# Patient Record
Sex: Male | Born: 1950 | Race: White | Hispanic: No | Marital: Married | Smoking: Never smoker
Health system: Southern US, Community
[De-identification: ages and names within clinical notes are randomized; demographics above are authoritative.]

## PROBLEM LIST (undated history)

## (undated) DIAGNOSIS — I10 Essential (primary) hypertension: Secondary | ICD-10-CM

## (undated) DIAGNOSIS — G4730 Sleep apnea, unspecified: Secondary | ICD-10-CM

## (undated) DIAGNOSIS — F319 Bipolar disorder, unspecified: Secondary | ICD-10-CM

## (undated) DIAGNOSIS — E119 Type 2 diabetes mellitus without complications: Secondary | ICD-10-CM

## (undated) DIAGNOSIS — C801 Malignant (primary) neoplasm, unspecified: Secondary | ICD-10-CM

---

## 2014-05-25 ENCOUNTER — Emergency Department (HOSPITAL_BASED_OUTPATIENT_CLINIC_OR_DEPARTMENT_OTHER)
Admission: EM | Admit: 2014-05-25 | Discharge: 2014-05-25 | Disposition: A | Discharge status: Home / Self Care | Payer: 59 | Attending: Emergency Medicine | Admitting: Emergency Medicine

## 2014-05-25 ENCOUNTER — Emergency Department (HOSPITAL_BASED_OUTPATIENT_CLINIC_OR_DEPARTMENT_OTHER): Payer: 59

## 2014-05-25 ENCOUNTER — Encounter (HOSPITAL_BASED_OUTPATIENT_CLINIC_OR_DEPARTMENT_OTHER): Payer: Self-pay | Admitting: *Deleted

## 2014-05-25 DIAGNOSIS — Y9289 Other specified places as the place of occurrence of the external cause: Secondary | ICD-10-CM | POA: Diagnosis not present

## 2014-05-25 DIAGNOSIS — Z79899 Other long term (current) drug therapy: Secondary | ICD-10-CM | POA: Diagnosis not present

## 2014-05-25 DIAGNOSIS — Y998 Other external cause status: Secondary | ICD-10-CM | POA: Diagnosis not present

## 2014-05-25 DIAGNOSIS — Z7951 Long term (current) use of inhaled steroids: Secondary | ICD-10-CM | POA: Diagnosis not present

## 2014-05-25 DIAGNOSIS — M20011 Mallet finger of right finger(s): Secondary | ICD-10-CM | POA: Diagnosis not present

## 2014-05-25 DIAGNOSIS — F319 Bipolar disorder, unspecified: Secondary | ICD-10-CM | POA: Diagnosis not present

## 2014-05-25 DIAGNOSIS — T1490XA Injury, unspecified, initial encounter: Secondary | ICD-10-CM

## 2014-05-25 DIAGNOSIS — I10 Essential (primary) hypertension: Secondary | ICD-10-CM | POA: Diagnosis not present

## 2014-05-25 DIAGNOSIS — W1839XA Other fall on same level, initial encounter: Secondary | ICD-10-CM | POA: Diagnosis not present

## 2014-05-25 DIAGNOSIS — E119 Type 2 diabetes mellitus without complications: Secondary | ICD-10-CM | POA: Insufficient documentation

## 2014-05-25 DIAGNOSIS — S6991XA Unspecified injury of right wrist, hand and finger(s), initial encounter: Secondary | ICD-10-CM | POA: Diagnosis present

## 2014-05-25 DIAGNOSIS — Y9389 Activity, other specified: Secondary | ICD-10-CM | POA: Insufficient documentation

## 2014-05-25 HISTORY — DX: Sleep apnea, unspecified: G47.30

## 2014-05-25 HISTORY — DX: Type 2 diabetes mellitus without complications: E11.9

## 2014-05-25 HISTORY — DX: Bipolar disorder, unspecified: F31.9

## 2014-05-25 HISTORY — DX: Essential (primary) hypertension: I10

## 2014-05-25 HISTORY — DX: Malignant (primary) neoplasm, unspecified: C80.1

## 2014-05-25 MED ORDER — HYDROCODONE-ACETAMINOPHEN 5-325 MG PO TABS: 1.0000 | ORAL_TABLET | Freq: Four times a day (QID) | ORAL | Status: DC | PRN

## 2014-05-25 MED ORDER — OXYCODONE-ACETAMINOPHEN 5-325 MG PO TABS
2.0000 | ORAL_TABLET | Freq: Once | ORAL | Status: AC
  Administered 2014-05-25: 2 via ORAL
  Filled 2014-05-25: qty 2

## 2014-05-25 NOTE — Discharge Instructions (Signed)
Take tylenol, motrin for pain.   Take vicodin for severe pain. Do NOT drive with it.   Keep the finger splint in place until you see ortho.   See either Dr. Barbaraann Barthel upstairs or Dr. Lenon Curt, hand doctor in a week.   Return to ER if you have severe pain, unable to move fingers.

## 2014-05-25 NOTE — ED Notes (Signed)
Patient states that he fell and landed on his right hand, feels he broke his right middle finger. C/o wrist pain as well

## 2014-05-25 NOTE — ED Notes (Signed)
MD at bedside. 

## 2014-05-25 NOTE — ED Provider Notes (Signed)
CSN: [id number]     Arrival date & time 05/25/14  1340 History   First MD Initiated Contact with Patient 05/25/14 1358     Chief Complaint  Patient presents with  . Finger Injury     (Consider location/radiation/quality/duration/timing/severity/associated sxs/prior Treatment) The history is provided by the patient.  Randy Martinez is a 64 y.o. male hx of HTN, DM, bipolar here with R hand injury. He was carrying some groceries and accidentally fell and landed on right hand. He states that he may have hyperflexed his middle finger and his right middle finger seem deformed afterwards. Also has wrist pain but denies any elbow or shoulder pain. Denies head injury or LOC.    Past Medical History  Diagnosis Date  . Hypertension   . Diabetes mellitus without complication   . Sleep apnea   . Bipolar 1 disorder   . skin    History reviewed. No pertinent past surgical history. No family history on file. History  Substance Use Topics  . Smoking status: Never Smoker   . Smokeless tobacco: Not on file  . Alcohol Use: No    Review of Systems  Musculoskeletal:       R hand and middle finger pain   All other systems reviewed and are negative.     Allergies  Bee venom; Lisinopril; and Medrol  Home Medications   Prior to Admission medications   Medication Sig Start Date End Date Taking? Authorizing Provider  ALPRAZolam Duanne Moron) 0.25 MG tablet Take 0.25 mg by mouth at bedtime as needed for anxiety.   Yes Historical Provider, MD  amLODipine (NORVASC) 10 MG tablet Take 10 mg by mouth daily.   Yes Historical Provider, MD  amphetamine-dextroamphetamine (ADDERALL) 10 MG tablet Take 10 mg by mouth daily with breakfast.   Yes Historical Provider, MD  buPROPion (WELLBUTRIN SR) 200 MG 12 hr tablet Take 200 mg by mouth 2 (two) times daily.   Yes Historical Provider, MD  carbamazepine (TEGRETOL) 200 MG tablet Take 200 mg by mouth 3 (three) times daily.   Yes Historical Provider, MD  cetirizine  (ZYRTEC) 10 MG tablet Take 10 mg by mouth daily.   Yes Historical Provider, MD  cloNIDine (CATAPRES) 0.1 MG tablet Take 0.1 mg by mouth 2 (two) times daily.   Yes Historical Provider, MD  furosemide (LASIX) 20 MG tablet Take 20 mg by mouth.   Yes Historical Provider, MD  losartan (COZAAR) 100 MG tablet Take 100 mg by mouth daily.   Yes Historical Provider, MD  omeprazole (PRILOSEC) 40 MG capsule Take 40 mg by mouth daily.   Yes Historical Provider, MD  simvastatin (ZOCOR) 40 MG tablet Take 40 mg by mouth daily.   Yes Historical Provider, MD  sitaGLIPtin (JANUVIA) 100 MG tablet Take 100 mg by mouth daily.   Yes Historical Provider, MD  terazosin (HYTRIN) 5 MG capsule Take 5 mg by mouth at bedtime.   Yes Historical Provider, MD  traZODone (DESYREL) 50 MG tablet Take 50 mg by mouth at bedtime.   Yes Historical Provider, MD  venlafaxine (EFFEXOR) 100 MG tablet Take 100 mg by mouth 2 (two) times daily.   Yes Historical Provider, MD  mometasone-formoterol (DULERA) 100-5 MCG/ACT AERO Inhale 2 puffs into the lungs 2 (two) times daily.    Historical Provider, MD   BP 164/96 mmHg  Pulse 84  Temp(Src) 98.6 F (37 C) (Oral)  Resp 20  Ht 5\' 8"  (1.727 m)  Wt 278 lb (126.1 kg)  BMI 42.28  kg/m2  SpO2 96% Physical Exam  Constitutional: He is oriented to person, place, and time. He appears well-developed and well-nourished.  HENT:  Head: Normocephalic and atraumatic.  Mouth/Throat: Oropharynx is clear and moist.  Eyes: Pupils are equal, round, and reactive to light.  Neck: Normal range of motion.  Cardiovascular: Normal rate.   Pulmonary/Chest: Effort normal.  Abdominal: Soft.  Musculoskeletal:  R middle finger flexed at PIP joint, unable to extend. No obvious bony tenderness. Mild tenderness over metacarpals but no obvious deformity. Dec ROM wrist and mild wrist tenderness. 2+ pulses, nl capillary refill. No forearm or elbow or shoulder or upper arm tenderness. No other obvious extremity trauma    Neurological: He is alert and oriented to person, place, and time.  Skin: Skin is warm and dry.  Psychiatric: He has a normal mood and affect. His behavior is normal. Judgment and thought content normal.  Nursing note and vitals reviewed.   ED Course  Procedures (including critical care time) Labs Review Labs Reviewed - No data to display  Imaging Review Dg Wrist Complete Right  05/25/2014   CLINICAL DATA:  [place] today and injured right hand and wrist.  EXAM: RIGHT WRIST - COMPLETE 3+ VIEW; RIGHT HAND - COMPLETE 3+ VIEW  COMPARISON:  None.  FINDINGS: Right hand:  The joint spaces are maintained.  No acute bony findings.  Right wrist:  The joint spaces are maintained. Mild degenerative changes. No acute fracture.  IMPRESSION: Mild degenerative changes but no acute fracture.   Electronically Signed   By: Kalman Jewels M.D.   On: 05/25/2014 14:23   Dg Hand Complete Right  05/25/2014   CLINICAL DATA:  [place] today and injured right hand and wrist.  EXAM: RIGHT WRIST - COMPLETE 3+ VIEW; RIGHT HAND - COMPLETE 3+ VIEW  COMPARISON:  None.  FINDINGS: Right hand:  The joint spaces are maintained.  No acute bony findings.  Right wrist:  The joint spaces are maintained. Mild degenerative changes. No acute fracture.  IMPRESSION: Mild degenerative changes but no acute fracture.   Electronically Signed   By: Kalman Jewels M.D.   On: 05/25/2014 14:23     EKG Interpretation None      MDM   Final diagnoses:  Hand injury, right, initial encounter    Randy Martinez is a 64 y.o. male here with fall. Likely mallet finger. Will get xrays and give pain meds.   3:01 PM Xray showed no fracture. Likely mallet finger. Splinted in extension. Given wrist splint for comfort. Will have him f/u with hand.     Wandra Arthurs, MD 05/25/14 [phone]

## 2014-06-03 ENCOUNTER — Encounter: Payer: Self-pay | Admitting: Family Medicine

## 2014-06-03 ENCOUNTER — Ambulatory Visit (INDEPENDENT_AMBULATORY_CARE_PROVIDER_SITE_OTHER): Payer: 59 | Admitting: Family Medicine

## 2014-06-03 VITALS — BP 165/99 | HR 86 | Ht 68.0 in | Wt 280.0 lb

## 2014-06-03 DIAGNOSIS — S63501A Unspecified sprain of right wrist, initial encounter: Secondary | ICD-10-CM

## 2014-06-03 DIAGNOSIS — M20011 Mallet finger of right finger(s): Secondary | ICD-10-CM

## 2014-06-03 MED ORDER — HYDROCODONE-ACETAMINOPHEN 5-325 MG PO TABS: 1.0000 | ORAL_TABLET | Freq: Four times a day (QID) | ORAL | Status: AC | PRN

## 2014-06-03 NOTE — Patient Instructions (Signed)
You have a mallet finger. It's critical you keep this joint extended at all times even when washing it (take the splint off like I showed you). Icing as needed  Ibuprofen or aleve as needed for pain and inflammation. Wear splint at all times for 6 weeks. Typically follow up with me at that time.  Then you use the splint only at bedtime for 2 more weeks assuming healing has occurred. You also have a wrist sprain. Icing, medications as noted above. Wrist brace as needed.

## 2014-06-05 DIAGNOSIS — S63501A Unspecified sprain of right wrist, initial encounter: Secondary | ICD-10-CM | POA: Insufficient documentation

## 2014-06-05 DIAGNOSIS — M20019 Mallet finger of unspecified finger(s): Secondary | ICD-10-CM | POA: Insufficient documentation

## 2014-06-05 NOTE — Assessment & Plan Note (Signed)
Right 3rd digit mallet finger - discussed importance of keeping 3rd DIP in extension for 6 weeks then at bedtime for 2 additional weeks.  Icing, nsaids.  Shown how to wash finger but keep it straight while removing splint.  F/u in 6 weeks at the latest.

## 2014-06-05 NOTE — Assessment & Plan Note (Signed)
reassured.  Wrist brace if needed.  Icing, nsaids.

## 2014-06-05 NOTE — Progress Notes (Signed)
PCP: No primary care provider on file.  Subjective:   HPI: Patient is a 64 y.o. male here for right hand/wrist injury.  Patient reports he was carrying groceries on 1/3 up the steps when he fell upwards and landed with axial loading injury to right 3rd digit. Swelling, pain, bruising distally. Difficulty extending tip of finger. Went to ED - no fractures but placed in a finger extension splint and wrist brace, referred here. No prior injuries.  Past Medical History  Diagnosis Date  . Hypertension   . Diabetes mellitus without complication   . Sleep apnea   . Bipolar 1 disorder   . skin     Current Outpatient Prescriptions on File Prior to Visit  Medication Sig Dispense Refill  . ALPRAZolam (XANAX) 0.25 MG tablet Take 0.25 mg by mouth at bedtime as needed for anxiety.    Marland Kitchen amLODipine (NORVASC) 10 MG tablet Take 10 mg by mouth daily.    Marland Kitchen amphetamine-dextroamphetamine (ADDERALL) 10 MG tablet Take 10 mg by mouth daily with breakfast.    . buPROPion (WELLBUTRIN SR) 200 MG 12 hr tablet Take 200 mg by mouth 2 (two) times daily.    . carbamazepine (TEGRETOL) 200 MG tablet Take 200 mg by mouth 3 (three) times daily.    . cetirizine (ZYRTEC) 10 MG tablet Take 10 mg by mouth daily.    . cloNIDine (CATAPRES) 0.1 MG tablet Take 0.1 mg by mouth 2 (two) times daily.    . furosemide (LASIX) 20 MG tablet Take 20 mg by mouth.    . losartan (COZAAR) 100 MG tablet Take 100 mg by mouth daily.    . mometasone-formoterol (DULERA) 100-5 MCG/ACT AERO Inhale 2 puffs into the lungs 2 (two) times daily.    Marland Kitchen omeprazole (PRILOSEC) 40 MG capsule Take 40 mg by mouth daily.    . simvastatin (ZOCOR) 40 MG tablet Take 40 mg by mouth daily.    . sitaGLIPtin (JANUVIA) 100 MG tablet Take 100 mg by mouth daily.    Marland Kitchen terazosin (HYTRIN) 5 MG capsule Take 5 mg by mouth at bedtime.    . traZODone (DESYREL) 50 MG tablet Take 50 mg by mouth at bedtime.    Marland Kitchen venlafaxine (EFFEXOR) 100 MG tablet Take 100 mg by mouth 2  (two) times daily.     No current facility-administered medications on file prior to visit.    No past surgical history on file.  Allergies  Allergen Reactions  . Bee Venom   . Lisinopril   . Medrol [Methylprednisolone]     History   Social History  . Marital Status: Married    Spouse Name: N/A    Number of Children: N/A  . Years of Education: N/A   Occupational History  . Not on file.   Social History Main Topics  . Smoking status: Never Smoker   . Smokeless tobacco: Not on file  . Alcohol Use: No  . Drug Use: Not on file  . Sexual Activity: Not on file   Other Topics Concern  . Not on file   Social History Narrative    No family history on file.  BP 165/99 mmHg  Pulse 86  Ht 5\' 8"  (1.727 m)  Wt 280 lb (127.007 kg)  BMI 42.58 kg/m2  Review of Systems: See HPI above.    Objective:  Physical Exam:  Gen: NAD  Right hand/wrist: Splint removed.  Mild swelling, bruising 3rd digit especially over distal phalanx area.  No malrotation or angulation. TTP  dorsally over 3rd DIP.  Less over dorsal wrist joint.  No other bony tenderness. Able to flex at 3rd PIP, DIP,MCP, extend at MCP and PIP.  Unable to extend at 3rd DIP - painful as well.  FROM wrist. NVI distally.  Assessment & Plan:  1. Right 3rd digit mallet finger - discussed importance of keeping 3rd DIP in extension for 6 weeks then at bedtime for 2 additional weeks.  Icing, nsaids.  Shown how to wash finger but keep it straight while removing splint.  F/u in 6 weeks at the latest.  2. Right wrist sprain - reassured.  Wrist brace if needed.  Icing, nsaids.

## 2014-07-15 ENCOUNTER — Encounter (INDEPENDENT_AMBULATORY_CARE_PROVIDER_SITE_OTHER): Payer: Self-pay

## 2014-07-15 ENCOUNTER — Ambulatory Visit (INDEPENDENT_AMBULATORY_CARE_PROVIDER_SITE_OTHER): Payer: 59 | Admitting: Family Medicine

## 2014-07-15 ENCOUNTER — Encounter: Payer: Self-pay | Admitting: Family Medicine

## 2014-07-15 VITALS — BP 120/79 | HR 75 | Ht 67.0 in | Wt 292.0 lb

## 2014-07-15 DIAGNOSIS — S63501D Unspecified sprain of right wrist, subsequent encounter: Secondary | ICD-10-CM

## 2014-07-15 DIAGNOSIS — M20011 Mallet finger of right finger(s): Secondary | ICD-10-CM

## 2014-07-15 NOTE — Patient Instructions (Addendum)
Wear the wrist brace as needed for the next few weeks. Icing 15 minutes at a time as needed. Wear finger splint for 2 more weeks only at bedtime then discontinue this. Consider using a stress ball or other easy motion exercises to regain flexion in this finger. Occupational therapy is a consideration if you're struggling but usually isn't necessary. I agree on going ahead with the MRI to assess for a fracture that was not seen on the x-rays. Follow up with me as needed.

## 2014-07-16 NOTE — Assessment & Plan Note (Signed)
reassured.  Wrist brace if needed.  Icing, nsaids.  F/u prn.

## 2014-07-16 NOTE — Progress Notes (Signed)
PCP: No primary care provider on file.  Subjective:   HPI: Patient is a 64 y.o. male here for right hand/wrist injury.  1/12: Patient reports he was carrying groceries on 1/3 up the steps when he fell upwards and landed with axial loading injury to right 3rd digit. Swelling, pain, bruising distally. Difficulty extending tip of finger. Went to ED - no fractures but placed in a finger extension splint and wrist brace, referred here. No prior injuries.  2/23: Patient reports his finger feels better - some pain at times. Compliant with the finger splint. Unfortunately he fell down about 10 stairs when trying to meet delivery person at bottom of his front porch on Monday. A lot of bruising, pain around hips and low back - had radiographs of pelvis and of his right wrist that were negative - PCP arranged for an MRI for this. Using right wrist brace again since this fall.  Past Medical History  Diagnosis Date  . Hypertension   . Diabetes mellitus without complication   . Sleep apnea   . Bipolar 1 disorder   . skin     Current Outpatient Prescriptions on File Prior to Visit  Medication Sig Dispense Refill  . ALPRAZolam (XANAX) 0.25 MG tablet Take 0.25 mg by mouth at bedtime as needed for anxiety.    Marland Kitchen amLODipine (NORVASC) 10 MG tablet Take 10 mg by mouth daily.    Marland Kitchen amphetamine-dextroamphetamine (ADDERALL) 10 MG tablet Take 10 mg by mouth daily with breakfast.    . buPROPion (WELLBUTRIN SR) 200 MG 12 hr tablet Take 200 mg by mouth 2 (two) times daily.    . carbamazepine (TEGRETOL) 200 MG tablet Take 200 mg by mouth 3 (three) times daily.    . cetirizine (ZYRTEC) 10 MG tablet Take 10 mg by mouth daily.    . cloNIDine (CATAPRES) 0.1 MG tablet Take 0.1 mg by mouth 2 (two) times daily.    . furosemide (LASIX) 20 MG tablet Take 20 mg by mouth.    Marland Kitchen HYDROcodone-acetaminophen (NORCO/VICODIN) 5-325 MG per tablet Take 1 tablet by mouth every 6 (six) hours as needed for moderate pain or severe  pain. 40 tablet 0  . losartan (COZAAR) 100 MG tablet Take 100 mg by mouth daily.    . mometasone-formoterol (DULERA) 100-5 MCG/ACT AERO Inhale 2 puffs into the lungs 2 (two) times daily.    Marland Kitchen omeprazole (PRILOSEC) 40 MG capsule Take 40 mg by mouth daily.    . simvastatin (ZOCOR) 40 MG tablet Take 40 mg by mouth daily.    . sitaGLIPtin (JANUVIA) 100 MG tablet Take 100 mg by mouth daily.    Marland Kitchen terazosin (HYTRIN) 5 MG capsule Take 5 mg by mouth at bedtime.    . traZODone (DESYREL) 50 MG tablet Take 50 mg by mouth at bedtime.    Marland Kitchen venlafaxine (EFFEXOR) 100 MG tablet Take 100 mg by mouth 2 (two) times daily.     No current facility-administered medications on file prior to visit.    No past surgical history on file.  Allergies  Allergen Reactions  . Adhadotae Rash    Adhesive tape.  Breaks out.   . Bee Venom   . Lisinopril   . Medrol [Methylprednisolone]     History   Social History  . Marital Status: Married    Spouse Name: N/A  . Number of Children: N/A  . Years of Education: N/A   Occupational History  . Not on file.   Social History Main  Topics  . Smoking status: Never Smoker   . Smokeless tobacco: Not on file  . Alcohol Use: No  . Drug Use: Not on file  . Sexual Activity: Not on file   Other Topics Concern  . Not on file   Social History Narrative    No family history on file.  BP 120/79 mmHg  Pulse 75  Ht 5\' 7"  (1.702 m)  Wt 292 lb (132.45 kg)  BMI 45.72 kg/m2  Review of Systems: See HPI above.    Objective:  Physical Exam:  Gen: NAD  Right hand/wrist: Splint removed.  Mild swelling, no bruising 3rd digit over distal phalanx area.  No malrotation or angulation. No TTP dorsally over 3rd DIP.  TTP over dorsal wrist joint more to ulnar side.  No snuffbox or other bony tenderness. Able to flex and extend at 3rd PIP, DIP, MCP against resistance. FROM wrist. NVI distally.  Assessment & Plan:  1. Right 3rd digit mallet finger - s/p 6 weeks of extension  splinting - advised to do this for 2 weeks at bedtime only now then start ROM exercises.  Icing, nsaids.  Consider occupational therapy if not improving.  2. Right wrist sprain - reassured.  Wrist brace if needed.  Icing, nsaids.  F/u prn.

## 2014-07-16 NOTE — Assessment & Plan Note (Signed)
Right 3rd digit mallet finger - s/p 6 weeks of extension splinting - advised to do this for 2 weeks at bedtime only now then start ROM exercises.  Icing, nsaids.  Consider occupational therapy if not improving.

## 2016-04-22 IMAGING — CR DG WRIST COMPLETE 3+V*R*
4 series · 4 of 4 positions shown · non-contrast
Comparison: None.

CLINICAL DATA: Fell today and injured right hand and wrist.

EXAM:
RIGHT WRIST - COMPLETE 3+ VIEW; RIGHT HAND - COMPLETE 3+ VIEW

[x wrist pa right]
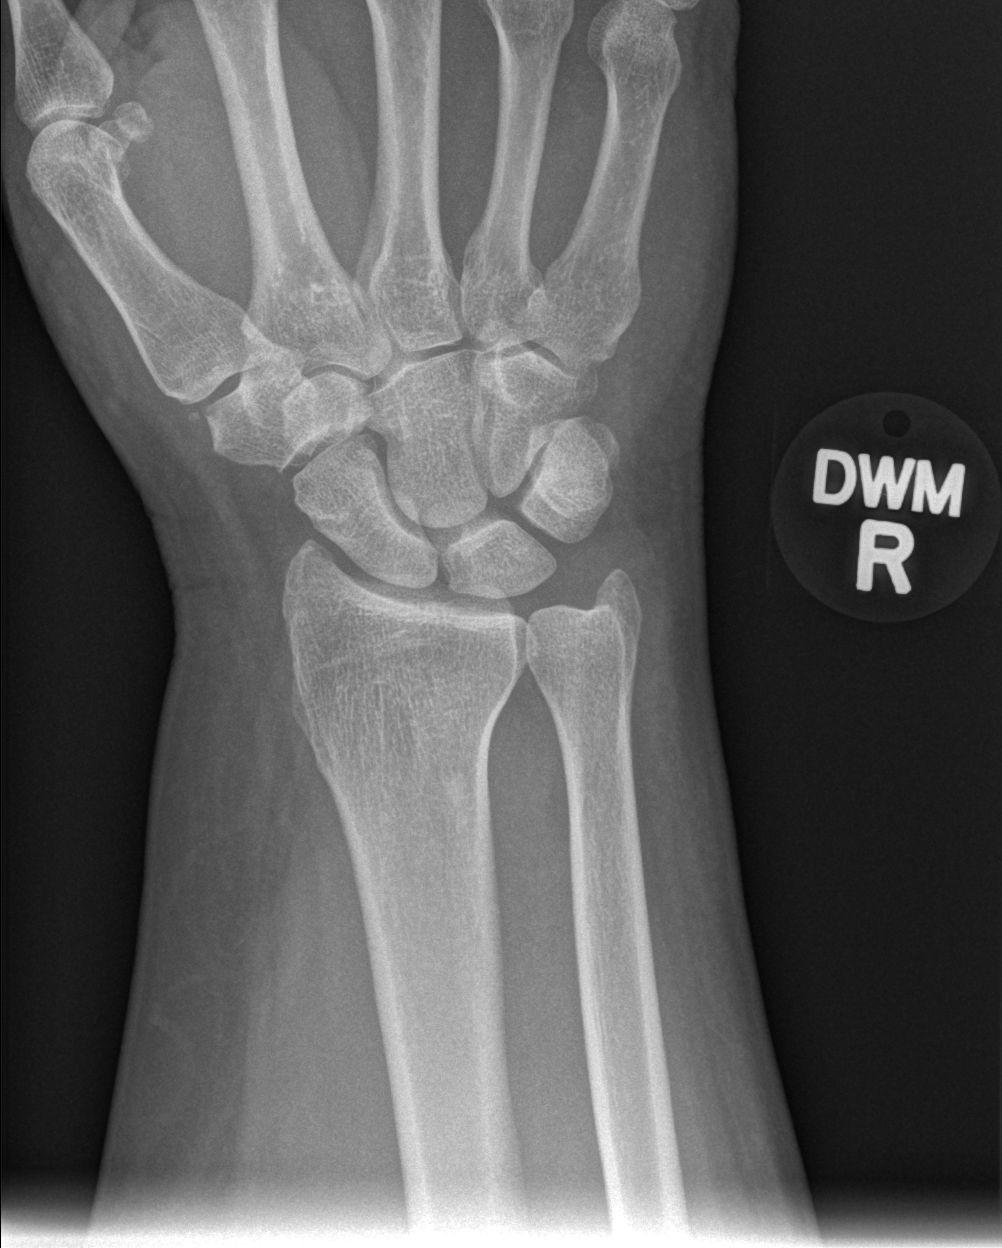

[x wrist obl right]
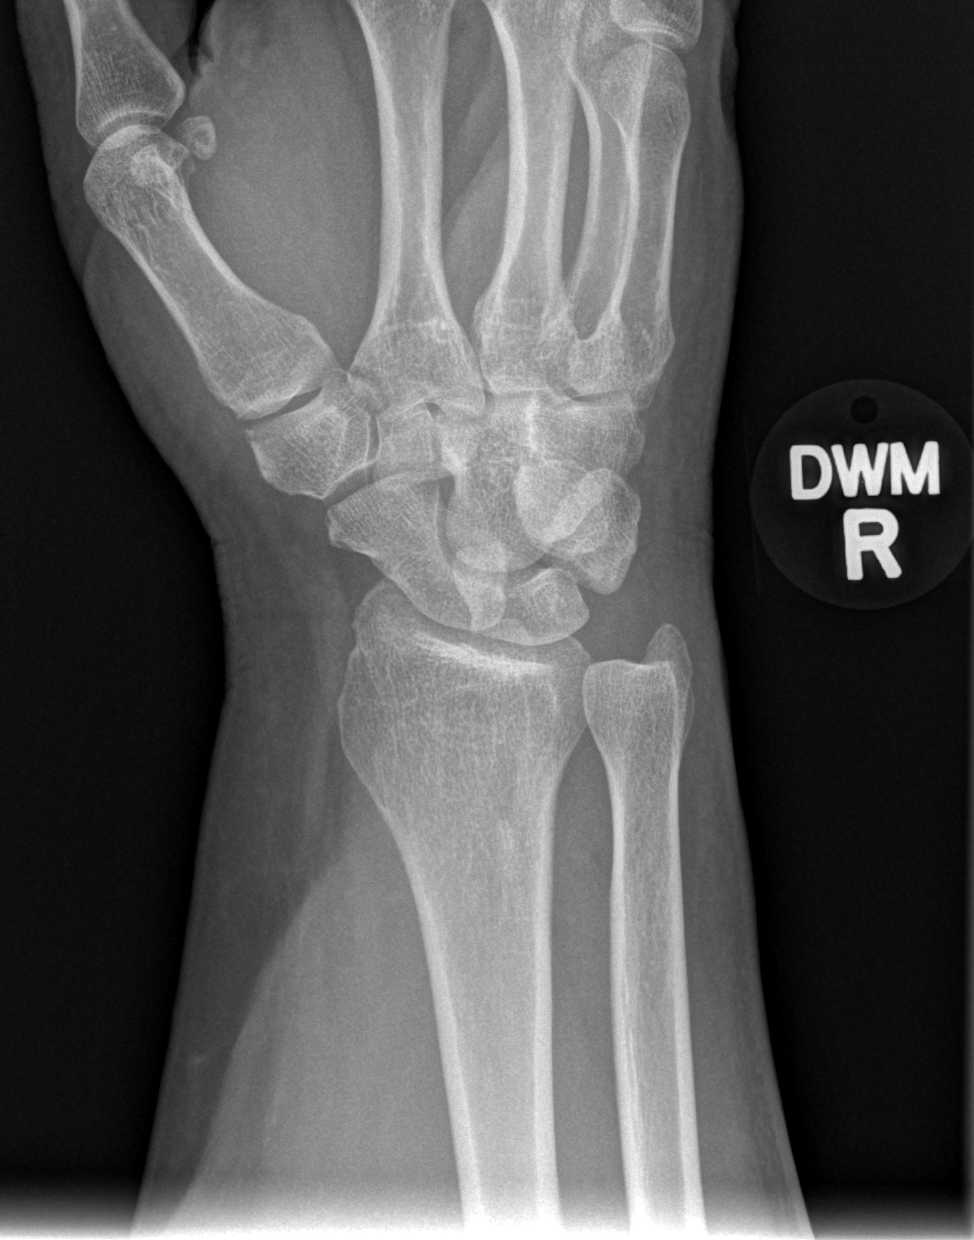

[x wrist lat right]
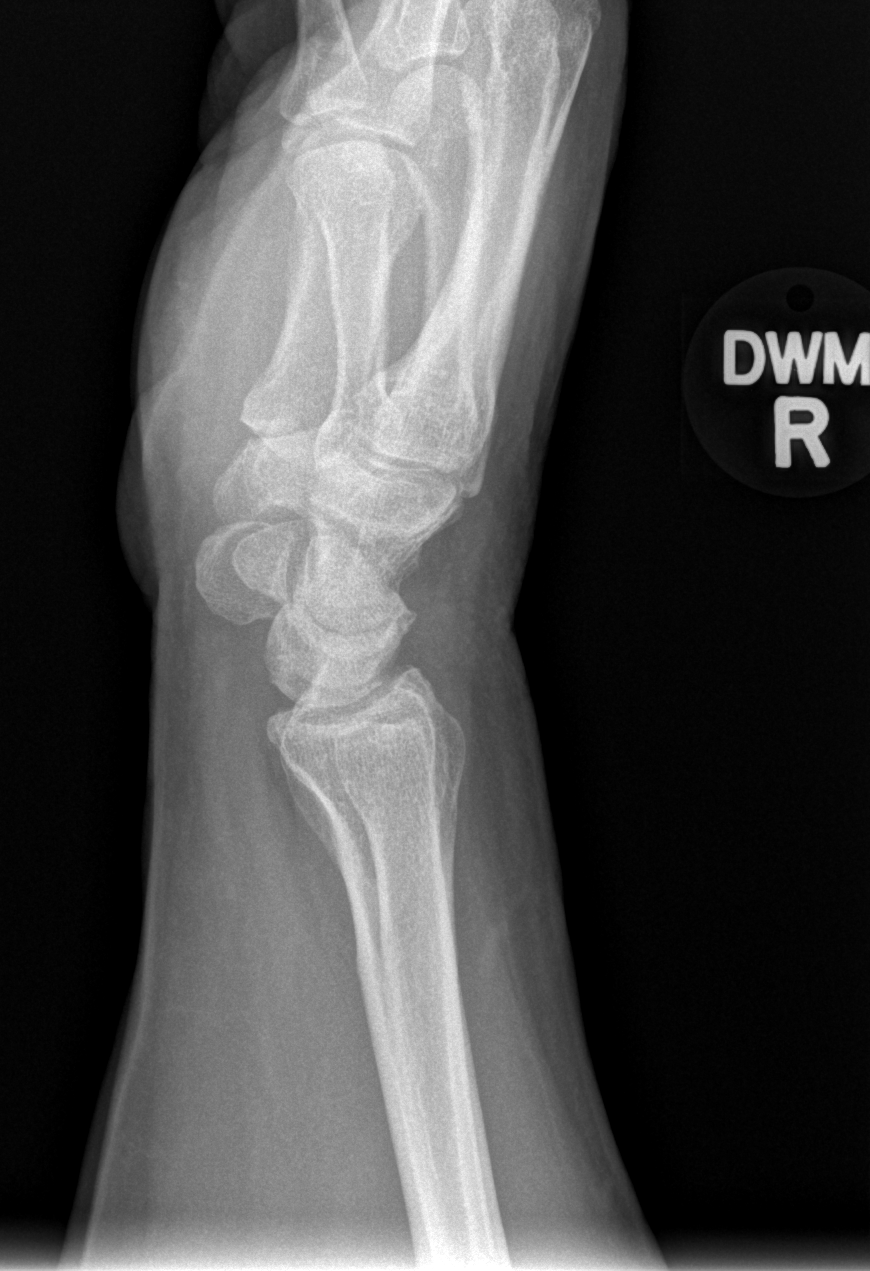

[x navicular]
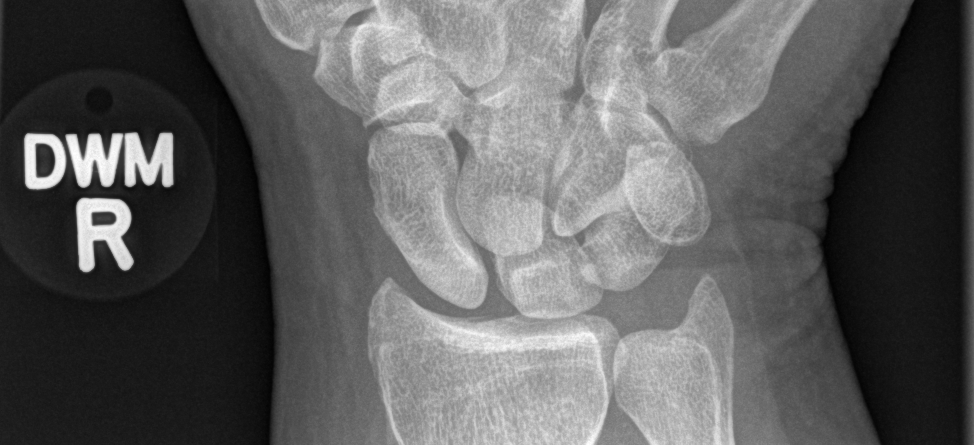

[4 of 4 positions shown; findings below may reference images not displayed]

FINDINGS: Right hand:

The joint spaces are maintained.  No acute bony findings.

Right wrist:

The joint spaces are maintained. Mild degenerative changes. No acute
fracture.
IMPRESSION: Mild degenerative changes but no acute fracture.
